# Patient Record
Sex: Male | Born: 1985 | Race: White | Hispanic: No | Marital: Married | Smoking: Never smoker
Health system: Southern US, Community
[De-identification: ages and names within clinical notes are randomized; demographics above are authoritative.]

## PROBLEM LIST (undated history)

## (undated) DIAGNOSIS — F419 Anxiety disorder, unspecified: Secondary | ICD-10-CM

## (undated) DIAGNOSIS — F32A Depression, unspecified: Secondary | ICD-10-CM

## (undated) DIAGNOSIS — F329 Major depressive disorder, single episode, unspecified: Secondary | ICD-10-CM

---
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
## 2010-10-15 ENCOUNTER — Emergency Department (HOSPITAL_BASED_OUTPATIENT_CLINIC_OR_DEPARTMENT_OTHER)
Admission: EM | Admit: 2010-10-15 | Discharge: 2010-10-15 | Payer: Self-pay | Source: Home / Self Care | Admitting: Emergency Medicine

## 2011-03-14 ENCOUNTER — Emergency Department (HOSPITAL_BASED_OUTPATIENT_CLINIC_OR_DEPARTMENT_OTHER)
Admission: EM | Admit: 2011-03-14 | Discharge: 2011-03-14 | Disposition: A | Discharge status: Home / Self Care | Payer: PRIVATE HEALTH INSURANCE | Attending: Emergency Medicine | Admitting: Emergency Medicine

## 2011-03-14 ENCOUNTER — Emergency Department (INDEPENDENT_AMBULATORY_CARE_PROVIDER_SITE_OTHER): Payer: PRIVATE HEALTH INSURANCE

## 2011-03-14 DIAGNOSIS — F988 Other specified behavioral and emotional disorders with onset usually occurring in childhood and adolescence: Secondary | ICD-10-CM | POA: Insufficient documentation

## 2011-03-14 DIAGNOSIS — R1031 Right lower quadrant pain: Secondary | ICD-10-CM | POA: Insufficient documentation

## 2011-03-14 DIAGNOSIS — R109 Unspecified abdominal pain: Secondary | ICD-10-CM

## 2011-03-14 DIAGNOSIS — E785 Hyperlipidemia, unspecified: Secondary | ICD-10-CM | POA: Insufficient documentation

## 2011-03-14 DIAGNOSIS — I10 Essential (primary) hypertension: Secondary | ICD-10-CM | POA: Insufficient documentation

## 2011-03-14 LAB — CBC
HCT: 40.5 % (ref 39.0–52.0)
Hemoglobin: 14.6 g/dL (ref 13.0–17.0)
MCHC: 36 g/dL (ref 30.0–36.0)
MCV: 80.8 fL (ref 78.0–100.0)
RDW: 12.6 % (ref 11.5–15.5)
WBC: 9.1 10*3/uL (ref 4.0–10.5)

## 2011-03-14 LAB — URINALYSIS, ROUTINE W REFLEX MICROSCOPIC
Bilirubin Urine: NEGATIVE
Glucose, UA: NEGATIVE mg/dL
Hgb urine dipstick: NEGATIVE
Ketones, ur: NEGATIVE mg/dL
Specific Gravity, Urine: 1.031 — ABNORMAL HIGH (ref 1.005–1.030)
Urobilinogen, UA: 0.2 mg/dL (ref 0.0–1.0)
pH: 6.5 (ref 5.0–8.0)

## 2011-03-14 LAB — COMPREHENSIVE METABOLIC PANEL
AST: 26 U/L (ref 0–37)
BUN: 20 mg/dL (ref 6–23)
CO2: 27 mEq/L (ref 19–32)
Chloride: 100 mEq/L (ref 96–112)
Creatinine, Ser: 0.8 mg/dL (ref 0.4–1.5)
GFR calc non Af Amer: >60 mL/min (ref >60)
Glucose, Bld: 123 mg/dL — ABNORMAL HIGH (ref 70–99)
Total Bilirubin: 0.3 mg/dL (ref 0.3–1.2)

## 2011-03-14 LAB — DIFFERENTIAL
Basophils Absolute: 0 10*3/uL (ref 0.0–0.1)
Basophils Relative: 0 % (ref 0–1)
Eosinophils Absolute: 0.3 10*3/uL (ref 0.0–0.7)
Eosinophils Relative: 3 % (ref 0–5)
Lymphocytes Relative: 18 % (ref 12–46)
Lymphs Abs: 1.6 10*3/uL (ref 0.7–4.0)
Monocytes Relative: 6 % (ref 3–12)
Neutro Abs: 6.6 10*3/uL (ref 1.7–7.7)
Neutrophils Relative %: 73 % (ref 43–77)

## 2011-03-14 LAB — LIPASE, BLOOD: Lipase: 17 U/L (ref 11–59)

## 2011-03-14 LAB — POCT OCCULT BLOOD STOOL (DEVICE): Fecal Occult Bld: NEGATIVE

## 2011-03-14 MED ORDER — IOHEXOL 300 MG/ML  SOLN
100.0000 mL | Freq: Once | INTRAMUSCULAR | Status: AC | PRN
  Administered 2011-03-14: 100 mL via INTRAVENOUS

## 2011-03-15 NOTE — Op Note (Signed)
   Chad Dunn, Chad Dunn                       ACCOUNT NO.:  [id number]   MEDICAL RECORD NO.:  [id number]                   PATIENT TYPE:  EMS   LOCATION:  ED                                   FACILITY:  [facility]   PHYSICIAN:  Nicki Reaper, M.D.                 DATE OF BIRTH:  01/17/1986   DATE OF PROCEDURE:  05/28/2002  DATE OF DISCHARGE:  05/28/2002                                 OPERATIVE REPORT   PREOPERATIVE DIAGNOSIS:  Laceration of extensor pollicis longus tendon,  right thumb.   POSTOPERATIVE DIAGNOSIS:  Laceration of extensor pollicis longus tendon,  right thumb.   PROCEDURE:  Repair of extensor pollicis longus tendon, right thumb.   SURGEON:  Nicki Reaper, M.D.   ASSISTANT:  None.   ANESTHESIA:  Metacarpal block.   INDICATIONS FOR PROCEDURE:  The patient is a 25 year old male who suffered  laceration of dorsal aspect of his proximal phalanx, right thumb.  He  complains of inability to extend.  He is seen at the request of the  emergency room physician.  Physical examination reveals a laceration over  the proximal phalanx with the inability to extend.   DESCRIPTION OF PROCEDURE:  The patient was given a metacarpal block with 1%  Xylocaine without epinephrine and prepped using Betadine solution.  A  Penrose drain was used for tourniquet control at the base of the finger.  The wound was opened.  The laceration to the extensor tendon was V-shaped in  nature.  This was repaired with multiple figure-of-eight, 4-0 [place]  sutures.  The joint had been exposed.  This was irrigated prior to closure.  The skin was closed with interrupted, 5-0 nylon sutures.  Sterile  compression dressing and splint was applied.  The patient tolerated the  procedure well.  He is discharged home to [facility] to return in one week.                                               Nicki Reaper, M.D.    GRK/MEDQ  D:  05/28/2002  T:  06/04/2002  Job:  [id number]

## 2011-03-15 NOTE — H&P (Signed)
   NAMECREIG, LANDIN                        ACCOUNT NO.:  [id number]   MEDICAL RECORD NO.:  [id number]                   PATIENT TYPE:  EMS   LOCATION:  ED                                   FACILITY:  [facility]   PHYSICIAN:  Nicki Reaper, M.D.                 DATE OF BIRTH:  09/21/1986   DATE OF ADMISSION:  05/28/2002  DATE OF DISCHARGE:                                HISTORY & PHYSICAL   PREOPERATIVE DIAGNOSIS:  Laceration of right thumb.   POSTOPERATIVE DIAGNOSIS:  Laceration of right thumb.   HISTORY:  The patient is an 25 year old male who suffered a laceration over  the dorsal aspect of his proximal phalanx of his right thumb.  He complains  of inability to extend, and was seen to compress by the emergency room  physician, Dr. Carren Rang.   PAST MEDICAL HISTORY:  Negative.   ALLERGIES:  He has no allergies.   MEDICATIONS:  He takes no medicines.   PHYSICAL EXAMINATION:  GENERAL: Physical examination reveals a well  developed, well nourished male in no acute distress.  Alert and oriented.  RIGHT HAND: He has a laceration, V shape in nature, over the dorsal aspect  of the proximal phalanx of his thumb with the inability to extend.  Neurovascularly, the thumb is otherwise is intact.  Flexors are intact.   DIAGNOSIS:  Laceration of extensor pollicis longus tendon, right thumb.   PLAN:  Repair.                                               Nicki Reaper, M.D.    GRK/MEDQ  D:  05/28/2002  T:  06/03/2002  Job:  [id number]

## 2011-09-18 ENCOUNTER — Ambulatory Visit (HOSPITAL_COMMUNITY): Payer: PRIVATE HEALTH INSURANCE | Admitting: Psychiatry

## 2011-11-20 ENCOUNTER — Ambulatory Visit (HOSPITAL_COMMUNITY): Payer: PRIVATE HEALTH INSURANCE | Admitting: Psychiatry

## 2012-09-19 ENCOUNTER — Encounter (HOSPITAL_BASED_OUTPATIENT_CLINIC_OR_DEPARTMENT_OTHER): Payer: Self-pay | Admitting: Emergency Medicine

## 2012-09-19 ENCOUNTER — Emergency Department (HOSPITAL_BASED_OUTPATIENT_CLINIC_OR_DEPARTMENT_OTHER)
Admission: EM | Admit: 2012-09-19 | Discharge: 2012-09-19 | Disposition: A | Discharge status: Home / Self Care | Payer: PRIVATE HEALTH INSURANCE | Attending: Emergency Medicine | Admitting: Emergency Medicine

## 2012-09-19 DIAGNOSIS — R110 Nausea: Secondary | ICD-10-CM | POA: Insufficient documentation

## 2012-09-19 DIAGNOSIS — H6690 Otitis media, unspecified, unspecified ear: Secondary | ICD-10-CM | POA: Insufficient documentation

## 2012-09-19 DIAGNOSIS — H6693 Otitis media, unspecified, bilateral: Secondary | ICD-10-CM

## 2012-09-19 MED ORDER — AZITHROMYCIN 250 MG PO TABS: 250.0000 mg | ORAL_TABLET | Freq: Every day | ORAL | Status: DC

## 2012-09-19 MED ORDER — ONDANSETRON HCL 4 MG PO TABS: 4.0000 mg | ORAL_TABLET | Freq: Four times a day (QID) | ORAL | Status: DC

## 2012-09-19 MED ORDER — ANTIPYRINE-BENZOCAINE 5.4-1.4 % OT SOLN
3.0000 [drp] | Freq: Once | OTIC | Status: AC
  Administered 2012-09-19: 4 [drp] via OTIC
  Filled 2012-09-19: qty 10

## 2012-09-19 MED ORDER — PROMETHAZINE HCL 25 MG/ML IJ SOLN
25.0000 mg | Freq: Once | INTRAMUSCULAR | Status: AC
  Administered 2012-09-19: 25 mg via INTRAMUSCULAR
  Filled 2012-09-19: qty 1

## 2012-09-19 MED ORDER — ONDANSETRON 8 MG PO TBDP
8.0000 mg | ORAL_TABLET | ORAL | Status: AC
  Administered 2012-09-19: 8 mg via ORAL
  Filled 2012-09-19: qty 1

## 2012-09-19 NOTE — ED Notes (Signed)
Patient stated that he still felt nauseous and was given medication. Patient was sipping ginger ale and eating cracker prior to leaving. Instructions given for medication administration

## 2012-09-19 NOTE — ED Notes (Signed)
Family member states pt feels nauseated and wants to lay on the bed for a short time until he feels better.  Pt asking for water, instructed to take small sips until nausea is relieved.

## 2012-09-19 NOTE — ED Provider Notes (Signed)
History     CSN: [id number]  Arrival date & time 09/19/12  0608   First MD Initiated Contact with Patient 09/19/12 [phone]      Chief Complaint  Patient presents with  . Otalgia    (Consider location/radiation/quality/duration/timing/severity/associated sxs/prior treatment) HPI Pt presenting with 24 hours of bilateral ear pain. Pt has had nasal congestion and URI symptoms over the past several days.  No fever, no drainage from ears.  Took percocet and vicodin tonight for pain and now has developed nausea and dry heaves.  Has also taken claritin D.  Pain initially in left ear, has moved to right ear as well.  No shortness of breath or cough.  No difficulty swallowing.  Has been able to keep down liquids.  There are no other associated systemic symptoms, there are no other alleviating or modifying factors.   History reviewed. No pertinent past medical history.  History reviewed. No pertinent past surgical history.  No family history on file.  History  Substance Use Topics  . Smoking status: Never Smoker   . Smokeless tobacco: Not on file  . Alcohol Use: No      Review of Systems ROS reviewed and all otherwise negative except for mentioned in HPI  Allergies  Review of patient's allergies indicates no known allergies.  Home Medications   Current Outpatient Rx  Name  Route  Sig  Dispense  Refill  . ATOMOXETINE HCL 10 MG PO CAPS   Oral   Take 80 mg by mouth daily.         Marland Kitchen ESCITALOPRAM OXALATE 20 MG PO TABS   Oral   Take 20 mg by mouth daily.         Marland Kitchen LORATADINE-PSEUDOEPHEDRINE ER 10-240 MG PO TB24   Oral   Take 1 tablet by mouth once.         . AZITHROMYCIN 250 MG PO TABS   Oral   Take 1 tablet (250 mg total) by mouth daily. Take 2 tabs po qD on the first day, then 1 tab po qD x 4 days   6 tablet   0   . ONDANSETRON HCL 4 MG PO TABS   Oral   Take 1 tablet (4 mg total) by mouth every 6 (six) hours.   12 tablet   0     BP 131/64  Pulse 97  Temp 98  F (36.7 C) (Oral)  Resp 21  SpO2 99% Vitals reviewed Physical Exam Physical Examination: General appearance - alert, well appearing, and in no distress Mental status - alert, oriented to person, place, and time Eyes - pupils equal and reactive, no conjunctival injection, no scleral icterus Ears - bilateral TM's with pus/bulging/erythema, bilateral EACs normal Mouth - mucous membranes moist, pharynx normal without lesions Chest - clear to auscultation, no wheezes, rales or rhonchi, symmetric air entry Heart - normal rate, regular rhythm, normal S1, S2, no murmurs, rubs, clicks or gallops Extremities - peripheral pulses normal, no pedal edema, no clubbing or cyanosis Skin - normal coloration and turgor, no rashes  ED Course  Procedures (including critical care time)  Labs Reviewed - No data to display No results found.   1. Bilateral otitis media       MDM  Pt with bilateral OM on exam- no perforation of TM.  Marland Kitchen  Nausea likely due to the pain medications.  He is overall nontoxic and well hydrated.  Pt started on auralgan, zofran.  Will also start on zpack.  Discharged with strict return precautions.  Pt agreeable with plan.        Ethelda Chick, MD 09/19/12 [phone]

## 2012-09-19 NOTE — ED Provider Notes (Signed)
Patient still nauseated prior to discharge.  Administered 25 mg Phenergan IM  Nelia Shi, MD 09/19/12 [phone]

## 2012-09-19 NOTE — ED Notes (Signed)
Pt c/o ear pain bilaterally since last night. Pt states he took a percocet earlier last night and a vicodin early this am. Pt states he has had vomiting since taking medications. Pt has nausea now.

## 2012-09-19 NOTE — ED Notes (Signed)
Pt requesting to lay in bed for a bit longer until zofran relieves nausea. Pt allowed to stay until feeling better. MD made aware.

## 2012-09-19 NOTE — ED Notes (Signed)
MD at bedside. 

## 2015-10-31 ENCOUNTER — Encounter (HOSPITAL_BASED_OUTPATIENT_CLINIC_OR_DEPARTMENT_OTHER): Payer: Self-pay | Admitting: *Deleted

## 2015-10-31 DIAGNOSIS — Z79899 Other long term (current) drug therapy: Secondary | ICD-10-CM | POA: Insufficient documentation

## 2015-10-31 DIAGNOSIS — K0889 Other specified disorders of teeth and supporting structures: Secondary | ICD-10-CM | POA: Insufficient documentation

## 2015-10-31 DIAGNOSIS — Z792 Long term (current) use of antibiotics: Secondary | ICD-10-CM | POA: Insufficient documentation

## 2015-10-31 DIAGNOSIS — F419 Anxiety disorder, unspecified: Secondary | ICD-10-CM | POA: Insufficient documentation

## 2015-10-31 DIAGNOSIS — F329 Major depressive disorder, single episode, unspecified: Secondary | ICD-10-CM | POA: Insufficient documentation

## 2015-10-31 DIAGNOSIS — Z79891 Long term (current) use of opiate analgesic: Secondary | ICD-10-CM | POA: Insufficient documentation

## 2015-10-31 NOTE — ED Notes (Signed)
Dental pain x 2 days

## 2015-11-01 ENCOUNTER — Emergency Department (HOSPITAL_BASED_OUTPATIENT_CLINIC_OR_DEPARTMENT_OTHER)
Admission: EM | Admit: 2015-11-01 | Discharge: 2015-11-01 | Disposition: A | Discharge status: Home / Self Care | Payer: PRIVATE HEALTH INSURANCE | Attending: Emergency Medicine | Admitting: Emergency Medicine

## 2015-11-01 DIAGNOSIS — K0889 Other specified disorders of teeth and supporting structures: Secondary | ICD-10-CM

## 2015-11-01 HISTORY — DX: Depression, unspecified: F32.A

## 2015-11-01 HISTORY — DX: Major depressive disorder, single episode, unspecified: F32.9

## 2015-11-01 HISTORY — DX: Anxiety disorder, unspecified: F41.9

## 2015-11-01 MED ORDER — KETOROLAC TROMETHAMINE 60 MG/2ML IM SOLN
60.0000 mg | Freq: Once | INTRAMUSCULAR | Status: AC
  Administered 2015-11-01: 60 mg via INTRAMUSCULAR
  Filled 2015-11-01: qty 2

## 2015-11-01 MED ORDER — OXYCODONE-ACETAMINOPHEN 5-325 MG PO TABS
1.0000 | ORAL_TABLET | Freq: Once | ORAL | Status: AC
  Administered 2015-11-01: 1 via ORAL
  Filled 2015-11-01: qty 1

## 2015-11-01 MED ORDER — BUPIVACAINE-EPINEPHRINE (PF) 0.5% -1:200000 IJ SOLN
INTRAMUSCULAR | Status: AC
  Administered 2015-11-01: 1.8 mL
  Filled 2015-11-01: qty 1.8

## 2015-11-01 MED ORDER — IBUPROFEN 600 MG PO TABS: 600.0000 mg | ORAL_TABLET | Freq: Four times a day (QID) | ORAL | Status: DC | PRN

## 2015-11-01 NOTE — ED Provider Notes (Signed)
CSN: [id number]     Arrival date & time 10/31/15  2233 History   First MD Initiated Contact with Patient 11/01/15 [phone]     Chief Complaint  Patient presents with  . Dental Pain     (Consider location/radiation/quality/duration/timing/severity/associated sxs/prior Treatment) HPI   This is a 30 year old male who presents with dental pain. Patient reports onset of symptoms on January 1. Reports left-sided sinus pressure. He saw his ENT doctor and received a steroid shot. He is currently on a Z-Pak. Patient states that he had worsening pain and was able to localize it to his left upper jaw and gumline. He subsequently saw his dentist who told him he has something  Wrong with his premolar. He was given hydrocodone. No dental procedures were performed. He reports hydrocodone is not helping him. He has not taken any anti-inflammatories. He reports dropping 10 out of 10 pain in the right upper premolar region. Denies fevers or sore throat.  Past Medical History  Diagnosis Date  . Anxiety   . Depression    History reviewed. No pertinent past surgical history. No family history on file. Social History  Substance Use Topics  . Smoking status: Never Smoker   . Smokeless tobacco: None  . Alcohol Use: No    Review of Systems  Constitutional: Negative for fever.  HENT: Positive for dental problem. Negative for facial swelling, sore throat and trouble swallowing.   All other systems reviewed and are negative.     Allergies  Review of patient's allergies indicates no known allergies.  Home Medications   Prior to Admission medications   Medication Sig Start Date End Date Taking? Authorizing Provider  atomoxetine (STRATTERA) 10 MG capsule Take 80 mg by mouth daily.    Historical Provider, MD  azithromycin (ZITHROMAX Z-PAK) 250 MG tablet Take 1 tablet (250 mg total) by mouth daily. Take 2 tabs po qD on the first day, then 1 tab po qD x 4 days 09/19/12   Jerelyn ScottMartha Linker, MD  escitalopram  (LEXAPRO) 20 MG tablet Take 20 mg by mouth daily.    Historical Provider, MD  ibuprofen (ADVIL,MOTRIN) 600 MG tablet Take 1 tablet (600 mg total) by mouth every 6 (six) hours as needed. 11/01/15   Shon Batonourtney F Horton, MD  loratadine-pseudoephedrine (CLARITIN-D 24-HOUR) 10-240 MG per 24 hr tablet Take 1 tablet by mouth once.    Historical Provider, MD  ondansetron (ZOFRAN) 4 MG tablet Take 1 tablet (4 mg total) by mouth every 6 (six) hours. 09/19/12   Jerelyn ScottMartha Linker, MD   BP 143/106 mmHg  Pulse 92  Temp(Src) 98.2 F (36.8 C) (Oral)  Resp 20  Ht 5\' 9"  (1.753 m)  Wt 193 lb (87.544 kg)  BMI 28.49 kg/m2  SpO2 98% Physical Exam  Constitutional: He is oriented to person, place, and time. He appears well-developed and well-nourished.   Uncomfortable appearing, no acute distress  HENT:  Head: Normocephalic and atraumatic.   No significant facial swelling noted, tenderness palpation of the right upper premolar region, no obvious abscess or infection noted, no trismus, normal elevation of the tongue  Eyes: Pupils are equal, round, and reactive to light.  Cardiovascular: Normal rate and regular rhythm.   Pulmonary/Chest: Effort normal. No respiratory distress.  Neurological: He is alert and oriented to person, place, and time.  Skin: Skin is warm and dry.  Psychiatric: He has a normal mood and affect.  Nursing note and vitals reviewed.   ED Course  Dental Date/Time: 11/01/2015 6:47 AM Performed  by: HORTON, COURTNEY F Authorized by: Shon Baton Consent: Verbal consent obtained. Risks and benefits: risks, benefits and alternatives were discussed Consent given by: patient Local anesthesia used: yes Anesthesia: local infiltration Local anesthetic: bupivacaine 0.5% with epinephrine Patient sedated: no Patient tolerance: Patient tolerated the procedure well with no immediate complications Comments:  Periapical dental block performed   (including critical care time)  Labs Review Labs  Reviewed - No data to display  Imaging Review No results found. I have personally reviewed and evaluated these images and lab results as part of my medical decision-making.   EKG Interpretation None      MDM   Final diagnoses:  Pain, dental     presents with persistent dental pain. Has seen his dentist and an ENT doctor. Is currently on antibiotics and hydrocodone. No obvious signs of infection on exam. No recent dental procedures. Patient was given IM Toradol and hydrocodone.  Total block performed. Encouraged patient to follow-up with his dentist as soon as possible for recheck.  After history, exam, and medical workup I feel the patient has been appropriately medically screened and is safe for discharge home. Pertinent diagnoses were discussed with the patient. Patient was given return precautions.     Shon Baton, MD 11/01/15 [phone]

## 2015-11-01 NOTE — Discharge Instructions (Signed)

## 2015-11-01 NOTE — ED Notes (Signed)
Call to waiting area w/o reply

## 2017-02-16 ENCOUNTER — Encounter (HOSPITAL_BASED_OUTPATIENT_CLINIC_OR_DEPARTMENT_OTHER): Payer: Self-pay | Admitting: Emergency Medicine

## 2017-02-16 ENCOUNTER — Emergency Department (HOSPITAL_BASED_OUTPATIENT_CLINIC_OR_DEPARTMENT_OTHER): Payer: BLUE CROSS/BLUE SHIELD

## 2017-02-16 ENCOUNTER — Emergency Department (HOSPITAL_BASED_OUTPATIENT_CLINIC_OR_DEPARTMENT_OTHER)
Admission: EM | Admit: 2017-02-16 | Discharge: 2017-02-16 | Disposition: A | Discharge status: Home / Self Care | Payer: BLUE CROSS/BLUE SHIELD | Attending: Emergency Medicine | Admitting: Emergency Medicine

## 2017-02-16 DIAGNOSIS — Y939 Activity, unspecified: Secondary | ICD-10-CM | POA: Insufficient documentation

## 2017-02-16 DIAGNOSIS — R0789 Other chest pain: Secondary | ICD-10-CM

## 2017-02-16 DIAGNOSIS — Y999 Unspecified external cause status: Secondary | ICD-10-CM | POA: Diagnosis not present

## 2017-02-16 DIAGNOSIS — W1781XA Fall down embankment (hill), initial encounter: Secondary | ICD-10-CM | POA: Insufficient documentation

## 2017-02-16 DIAGNOSIS — Y92830 Public park as the place of occurrence of the external cause: Secondary | ICD-10-CM | POA: Insufficient documentation

## 2017-02-16 DIAGNOSIS — Z79899 Other long term (current) drug therapy: Secondary | ICD-10-CM | POA: Diagnosis not present

## 2017-02-16 DIAGNOSIS — S299XXA Unspecified injury of thorax, initial encounter: Secondary | ICD-10-CM | POA: Diagnosis present

## 2017-02-16 MED ORDER — LIDOCAINE 5 % EX PTCH: 1.0000 | MEDICATED_PATCH | CUTANEOUS | 0 refills | Status: DC

## 2017-02-16 MED ORDER — IBUPROFEN 800 MG PO TABS: 800.0000 mg | ORAL_TABLET | Freq: Three times a day (TID) | ORAL | 0 refills | Status: AC | PRN

## 2017-02-16 MED ORDER — IBUPROFEN 800 MG PO TABS
800.0000 mg | ORAL_TABLET | Freq: Once | ORAL | Status: AC
  Administered 2017-02-16: 800 mg via ORAL
  Filled 2017-02-16: qty 1

## 2017-02-16 NOTE — ED Provider Notes (Signed)
Emergency Department Provider Note  By signing my name below, I, Avnee Patel, attest that this documentation has been prepared under the direction and in the presence of Maia Plan, MD  Electronically Signed: Clovis Pu, ED Scribe. 02/16/17. 8:27 PM.  I have reviewed the triage vital signs and the nursing notes.   HISTORY  Chief Complaint Fall   HPI Chad Dunn is a 31 y.o. male complaining of acute onset, moderate left sided chest pain s/pan incident which occurred earlier today. Pt states he suddenly felt s pressurized sensation while [place] the park. His pain is worse with deep breathing and with palpation. He has applied ice with no relief. Pt denies any other associated symptoms. No other complaints noted at this time. No radiation.    Past Medical History:  Diagnosis Date  . Anxiety   . Depression     There are no active problems to display for this patient.   History reviewed. No pertinent surgical history.  Current Outpatient Rx  . Order #: [id number] Class: Historical Med  . Order #: [id number] Class: Print  . Order #: [id number] Class: Historical Med  . Order #: [id number] Class: Print  . Order #: [id number] Class: Print  . Order #: [id number] Class: Historical Med  . Order #: [id number] Class: Print    Allergies Patient has no known allergies.  No family history on file.  Social History Social History  Substance Use Topics  . Smoking status: Never Smoker  . Smokeless tobacco: Not on file  . Alcohol use No    Review of Systems  Constitutional: No fever/chills Eyes: No visual changes. ENT: No sore throat. Cardiovascular: +chest pain. Respiratory: Denies shortness of breath. Gastrointestinal: No abdominal pain.  No nausea, no vomiting.  No diarrhea.  No constipation. Genitourinary: Negative for dysuria. Musculoskeletal: Negative for back pain. Positive chest wall pain.  Skin: Negative for rash. Neurological: Negative for headaches,  focal weakness or numbness.  10-point ROS otherwise negative.  ____________________________________________   PHYSICAL EXAM:  VITAL SIGNS: ED Triage Vitals  Enc Vitals Group     BP 02/16/17 1958 127/86     Pulse Rate 02/16/17 1958 94     Resp 02/16/17 1958 18     Temp 02/16/17 1958 98.4 F (36.9 C)     Temp Source 02/16/17 1958 Oral     SpO2 02/16/17 1958 98 %     Weight 02/16/17 1958 206 lb (93.4 kg)     Height 02/16/17 1958  (1.778 m)     Pain Score 02/16/17 2003 9   Constitutional: Alert and oriented. Well appearing and in no acute distress. Eyes: Conjunctivae are normal.  Head: Atraumatic. Nose: No congestion/rhinnorhea. Mouth/Throat: Mucous membranes are moist.  Oropharynx non-erythematous. Neck: No stridor. Cardiovascular: Normal rate, regular rhythm. Good peripheral circulation. Grossly normal heart sounds.   Respiratory: Normal respiratory effort.  No retractions. Lungs CTAB. Gastrointestinal: Soft and nontender. No distention.  Musculoskeletal: No lower extremity tenderness nor edema. No gross deformities of extremities. Positive tenderness to palpation of the left, anterior chest wall.  Neurologic:  Normal speech and language. No gross focal neurologic deficits are appreciated.  Skin:  Skin is warm, dry and intact. No rash noted. Psychiatric: Mood and affect are normal. Speech and behavior are normal.  ____________________________________________  RADIOLOGY  Dg Chest 2 View  Result Date: 02/16/2017 CLINICAL DATA:  32 year old male with history of trauma after falling down a hill complaining of left upper chest pain. EXAM: CHEST  2 VIEW COMPARISON:  No priors. FINDINGS: Lung volumes are normal. No consolidative airspace disease. No pleural effusions. No pneumothorax. No pulmonary nodule or mass noted. Pulmonary vasculature and the cardiomediastinal silhouette are within normal limits. IMPRESSION: No radiographic evidence of acute cardiopulmonary disease.  Electronically Signed   By: Trudie Reed M.D.   On: 02/16/2017 20:27    ____________________________________________   PROCEDURES  Procedure(s) performed:   Procedures  None ____________________________________________   INITIAL IMPRESSION / ASSESSMENT AND PLAN / ED COURSE  Pertinent labs & imaging results that were available during my care of the patient were reviewed by me and considered in my medical decision making (see chart for details).  Patient presents to the ED with chest wall pain after rolling down the hill with his child. He has tenderness to palpation over the chest wall. Clinically this is consistent with MSK related chest pain. Extremely low suspicion for ACS, PE, or other vascular etiology of pain. CXR with no PNX or obvious rib fx. Will manage pain at home with NSAIDs. Plan for PCP follow up.   At this time, I do not feel there is any life-threatening condition present. I have reviewed and discussed all results (EKG, imaging, lab, urine as appropriate), exam findings with patient. I have reviewed nursing notes and appropriate previous records.  I feel the patient is safe to be discharged home without further emergent workup. Discussed usual and customary return precautions. Patient and family (if present) verbalize understanding and are comfortable with this plan.  Patient will follow-up with their primary care provider. If they do not have a primary care provider, information for follow-up has been provided to them. All questions have been answered.  ____________________________________________  FINAL CLINICAL IMPRESSION(S) / ED DIAGNOSES  Final diagnoses:  Chest wall pain    MEDICATIONS GIVEN DURING THIS VISIT:  Medications  ibuprofen (ADVIL,MOTRIN) tablet 800 mg (800 mg Oral Given 02/16/17 2056)    NEW OUTPATIENT MEDICATIONS STARTED DURING THIS VISIT:  Discharge Medication List as of 02/16/2017  8:41 PM    START taking these medications   Details    lidocaine (LIDODERM) 5 % Place 1 patch onto the skin daily. Remove & Discard patch within 12 hours or as directed by MD, Starting Sun 02/16/2017, Print        Note:  This document was prepared using Dragon voice recognition software and may include unintentional dictation errors.  Alona Bene, MD Emergency Medicine  I personally performed the services described in this documentation, which was scribed in my presence. The recorded information has been reviewed and is accurate.       Maia Plan, MD 02/17/17 [phone]

## 2017-02-16 NOTE — Discharge Instructions (Signed)

## 2017-02-16 NOTE — ED Notes (Signed)
Pt discharged to home with family. NAD.  

## 2017-02-16 NOTE — ED Triage Notes (Signed)
Pt presents to ED with c/o left chest pain after falling and rolling with child at the park. Pt reports pain with inspiration and palpation.

## 2017-04-04 ENCOUNTER — Emergency Department (HOSPITAL_BASED_OUTPATIENT_CLINIC_OR_DEPARTMENT_OTHER)
Admission: EM | Admit: 2017-04-04 | Discharge: 2017-04-04 | Disposition: A | Discharge status: Home / Self Care | Payer: BLUE CROSS/BLUE SHIELD | Attending: Emergency Medicine | Admitting: Emergency Medicine

## 2017-04-04 ENCOUNTER — Encounter (HOSPITAL_BASED_OUTPATIENT_CLINIC_OR_DEPARTMENT_OTHER): Payer: Self-pay

## 2017-04-04 DIAGNOSIS — K0889 Other specified disorders of teeth and supporting structures: Secondary | ICD-10-CM | POA: Insufficient documentation

## 2017-04-04 DIAGNOSIS — Z79899 Other long term (current) drug therapy: Secondary | ICD-10-CM | POA: Insufficient documentation

## 2017-04-04 MED ORDER — OXYCODONE-ACETAMINOPHEN 5-325 MG PO TABS: 1.0000 | ORAL_TABLET | Freq: Four times a day (QID) | ORAL | 0 refills | Status: DC | PRN

## 2017-04-04 NOTE — ED Provider Notes (Signed)
[facility] Provider Note   CSN: [id number] Arrival date & time: 04/04/17  0502     History   Chief Complaint Chief Complaint  Patient presents with  . Dental Pain    HPI Chad Dunn is a 31 y.o. male.  HPI  Pt presneting with c/o left sided dental pain. He has left upper and lower wisdom teeth coming in and has been having worsening pain over the past several days.  He was seen at urgent care yesterday and given rx for mobic and augmentin- he has taken one dose of each without any relief of the pain.  Was not able to sleep last night due to the pain.  No fever/chills. Pain is throbbing and constant.  He has also been trying cold packs on face.  He also took some ibuprofen.  No difficulty breathing or swallowing.  There are no other associated systemic symptoms, there are no other alleviating or modifying factors.   Past Medical History:  Diagnosis Date  . Anxiety   . Depression     There are no active problems to display for this patient.   History reviewed. No pertinent surgical history.     Home Medications    Prior to Admission medications   Medication Sig Start Date End Date Taking? Authorizing Provider  atomoxetine (STRATTERA) 10 MG capsule Take 80 mg by mouth daily.    [provider]  azithromycin (ZITHROMAX Z-PAK) 250 MG tablet Take 1 tablet (250 mg total) by mouth daily. Take 2 tabs po qD on the first day, then 1 tab po qD x 4 days 09/19/12   Jerelyn ScottLinker, Martha, MD  escitalopram (LEXAPRO) 20 MG tablet Take 20 mg by mouth daily.    [provider]  ibuprofen (ADVIL,MOTRIN) 800 MG tablet Take 1 tablet (800 mg total) by mouth every 8 (eight) hours as needed. 02/16/17   Long, Arlyss RepressJoshua G, MD  lidocaine (LIDODERM) 5 % Place 1 patch onto the skin daily. Remove & Discard patch within 12 hours or as directed by MD 02/16/17   Long, Arlyss RepressJoshua G, MD  loratadine-pseudoephedrine (CLARITIN-D 24-HOUR) 10-240 MG per 24 hr tablet Take 1 tablet by mouth  once.    [provider]  ondansetron (ZOFRAN) 4 MG tablet Take 1 tablet (4 mg total) by mouth every 6 (six) hours. 09/19/12   Jerelyn ScottLinker, Martha, MD  oxyCODONE-acetaminophen (PERCOCET/ROXICET) 5-325 MG tablet Take 1-2 tablets by mouth every 6 (six) hours as needed for severe pain. 04/04/17   Jerelyn ScottLinker, Martha, MD    Family History No family history on file.  Social History Social History  Substance Use Topics  . Smoking status: Never Smoker  . Smokeless tobacco: Not on file  . Alcohol use No     Allergies   Patient has no known allergies.   Review of Systems Review of Systems  ROS reviewed and all otherwise negative except for mentioned in HPI   Physical Exam Updated Vital Signs BP (!) 140/95 (BP Location: Right Arm)   Pulse 95   Temp 98.1 F (36.7 C) (Oral)   Resp 18   Ht 5\' 10"  (1.778 m)   Wt 95.3 kg (210 lb)   SpO2 98%   BMI 30.13 kg/m  Vitals reivewed Physical Exam Physical Examination: General appearance - alert, well appearing, and in no distress Mental status - alert, oriented to person, place, and time Eyes - no conjunctival injection no scleral icterus Mouth - MMM, mild left sided facial swelling, partial eruption of left  upper and left lower posterior molars with tenderness to palpation of both Neck - supple, no significant adenopathy Chest - normal respiratory effort Neurological - alert, oriented, normal speech Extremities - peripheral pulses normal, no pedal edema, no clubbing or cyanosis Skin - normal coloration and turgor, no rashes  ED Treatments / Results  Labs (all labs ordered are listed, but only abnormal results are displayed) Labs Reviewed - No data to display  EKG  EKG Interpretation None       Radiology No results found.  Procedures Procedures (including critical care time)  Medications Ordered in ED Medications - No data to display   Initial Impression / Assessment and Plan / ED Course  I have reviewed the triage vital  signs and the nursing notes.  Pertinent labs & imaging results that were available during my care of the patient were reviewed by me and considered in my medical decision making (see chart for details).     Pt presenting with ongoing dental pain that has not responded to meloxicam, ibuprofen, ice packs.  He has been prescribed augmentin and has started taking that.  He has evidence of partial eruption of wisdom teeth on left and likely abscess.  Will give rx for pain control.  Pt has already started trying to contact dentists for extraction.  Discharged with strict return precautions.  Pt agreeable with plan.  Final Clinical Impressions(s) / ED Diagnoses   Final diagnoses:  Pain, dental    New Prescriptions Discharge Medication List as of 04/04/2017  5:24 AM    START taking these medications   Details  oxyCODONE-acetaminophen (PERCOCET/ROXICET) 5-325 MG tablet Take 1-2 tablets by mouth every 6 (six) hours as needed for severe pain., Starting Fri 04/04/2017, Print         Jerelyn Scott, MD 04/04/17 [phone]

## 2017-04-04 NOTE — ED Notes (Signed)
Pt verbalizes understanding of d/c instructions and denies any further needs at this time. 

## 2017-04-04 NOTE — ED Triage Notes (Signed)
Pt c/o dental pain for the last day, received prescription for mobic and augmentin, tried warm and cold compresses, tried ora-jel and nothing is helping

## 2017-04-04 NOTE — Discharge Instructions (Signed)
Return to the ED with any concerns including difficulty breathing or swallowing, fever/chills, vomiting and not able to keep down liquids or antibiotics, decreased level of alertness/lethargy, or any other alarming symptoms

## 2019-04-01 IMAGING — DX DG CHEST 2V
2 series · 2 of 2 positions shown · non-contrast
Comparison: No priors.

CLINICAL DATA: 30-year-old male with history of trauma after
falling down a hill complaining of left upper chest pain.

EXAM:
CHEST  2 VIEW

[chest pa]
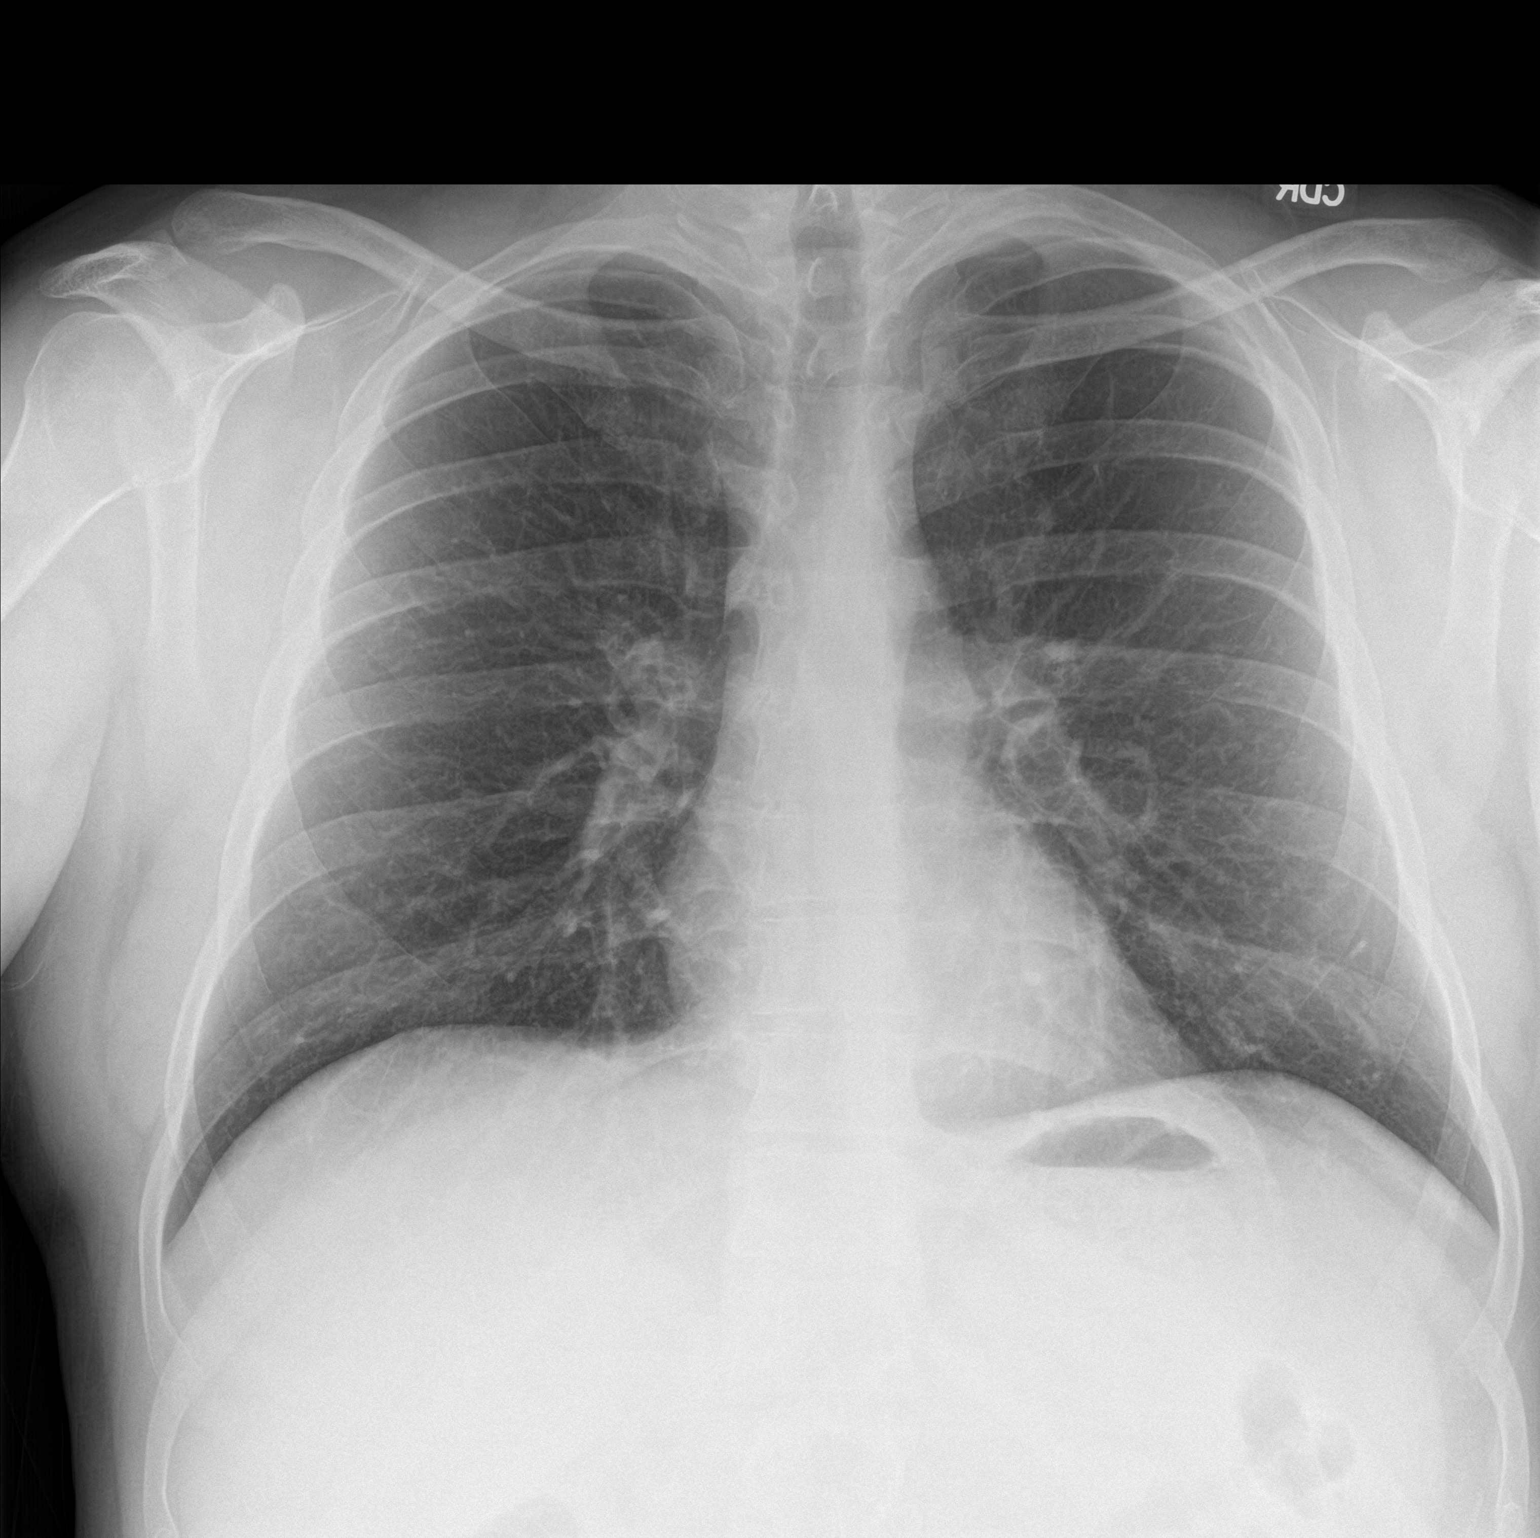

[chest lat]
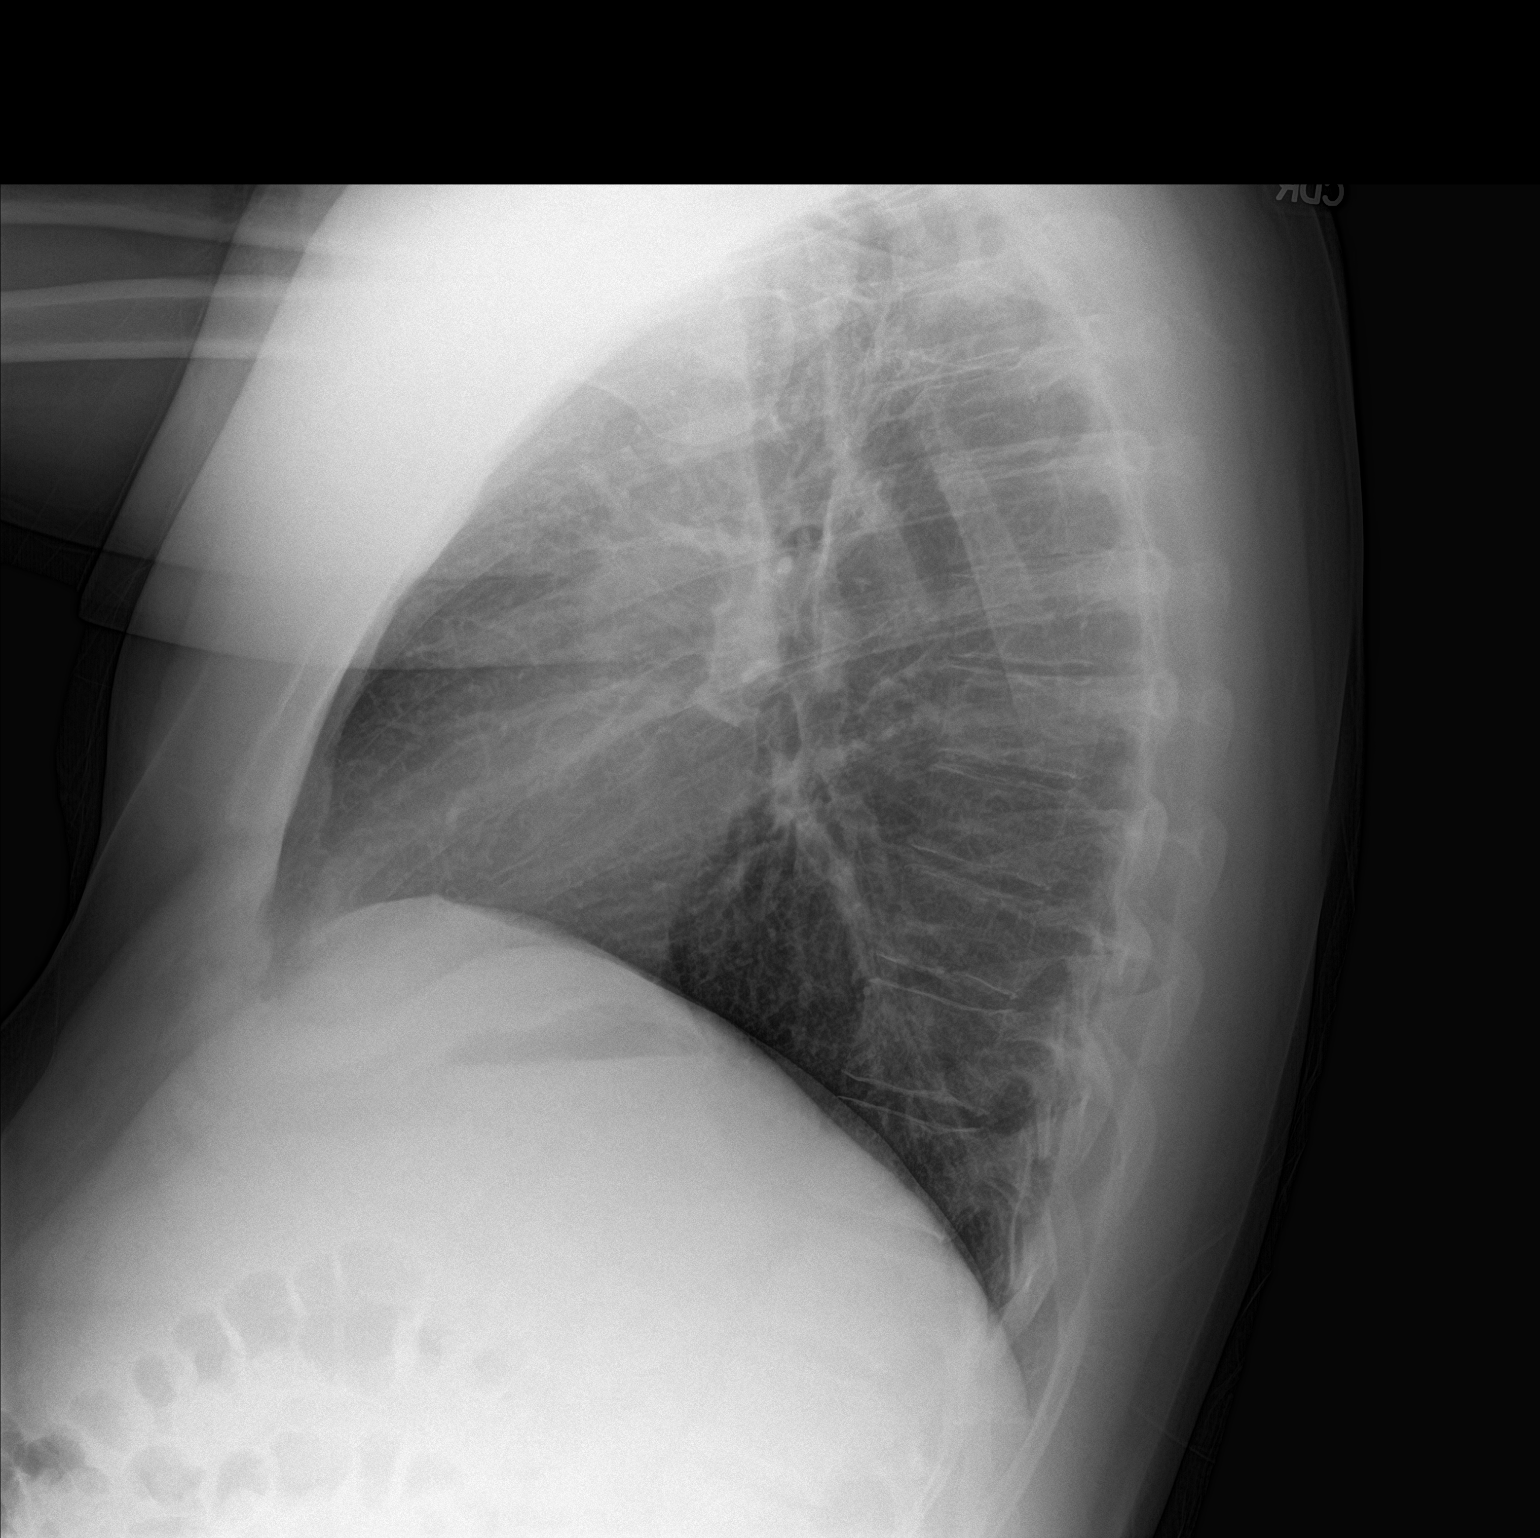

[2 of 2 positions shown; findings below may reference images not displayed]

FINDINGS: Lung volumes are normal. No consolidative airspace disease. No
pleural effusions. No pneumothorax. No pulmonary nodule or mass
noted. Pulmonary vasculature and the cardiomediastinal silhouette
are within normal limits.
IMPRESSION: No radiographic evidence of acute cardiopulmonary disease.

## 2023-07-20 ENCOUNTER — Emergency Department (HOSPITAL_BASED_OUTPATIENT_CLINIC_OR_DEPARTMENT_OTHER): Payer: Medicaid Other

## 2023-07-20 ENCOUNTER — Emergency Department (HOSPITAL_BASED_OUTPATIENT_CLINIC_OR_DEPARTMENT_OTHER)
Admission: EM | Admit: 2023-07-20 | Discharge: 2023-07-21 | Disposition: A | Discharge status: Home / Self Care | Payer: Medicaid Other | Attending: Emergency Medicine | Admitting: Emergency Medicine

## 2023-07-20 ENCOUNTER — Other Ambulatory Visit: Payer: Self-pay

## 2023-07-20 ENCOUNTER — Encounter (HOSPITAL_BASED_OUTPATIENT_CLINIC_OR_DEPARTMENT_OTHER): Payer: Self-pay | Admitting: Emergency Medicine

## 2023-07-20 DIAGNOSIS — S59911A Unspecified injury of right forearm, initial encounter: Secondary | ICD-10-CM | POA: Diagnosis present

## 2023-07-20 DIAGNOSIS — S59001A Unspecified physeal fracture of lower end of ulna, right arm, initial encounter for closed fracture: Secondary | ICD-10-CM | POA: Insufficient documentation

## 2023-07-20 DIAGNOSIS — W010XXA Fall on same level from slipping, tripping and stumbling without subsequent striking against object, initial encounter: Secondary | ICD-10-CM | POA: Diagnosis not present

## 2023-07-20 MED ORDER — OXYCODONE-ACETAMINOPHEN 5-325 MG PO TABS
2.0000 | ORAL_TABLET | Freq: Once | ORAL | Status: AC
  Administered 2023-07-20: 2 via ORAL
  Filled 2023-07-20: qty 2

## 2023-07-20 NOTE — ED Triage Notes (Signed)
Ot reports tripping and falling into the wooden frame of a box spring hitting his right forearm, swelling noted, no ecchymosis, limited ROM

## 2023-07-21 MED ORDER — OXYCODONE-ACETAMINOPHEN 5-325 MG PO TABS: 2.0000 | ORAL_TABLET | ORAL | 0 refills | Status: AC | PRN

## 2023-07-21 NOTE — ED Provider Notes (Signed)
[place] [facility] Provider Note   CSN: [id number] Arrival date & time: 07/20/23  2306     History  Chief Complaint  Patient presents with   Arm Injury    Chad Dunn is a 37 y.o. male.  37 yo M who tripped over a boxspring landing on right forearm with pain and pressure in distal ulnar side of the arm. No other injuries.    Arm Injury      Home Medications Prior to Admission medications   Medication Sig Start Date End Date Taking? Authorizing Provider  oxyCODONE-acetaminophen (PERCOCET) 5-325 MG tablet Take 2 tablets by mouth every 4 (four) hours as needed for severe pain. 07/21/23  Yes Wenda Vanschaick, Barbara Cower, MD  atomoxetine (STRATTERA) 10 MG capsule Take 80 mg by mouth daily.    [provider]  escitalopram (LEXAPRO) 20 MG tablet Take 20 mg by mouth daily.    [provider]  ibuprofen (ADVIL,MOTRIN) 800 MG tablet Take 1 tablet (800 mg total) by mouth every 8 (eight) hours as needed. 02/16/17   Long, Arlyss Repress, MD      Allergies    Patient has no known allergies.    Review of Systems   Review of Systems  Physical Exam Updated Vital Signs BP 124/79   Pulse 85   Temp 97.8 F (36.6 C) (Oral)   Resp 18   Ht 5\' 11"  (1.803 m)   Wt 91.2 kg   SpO2 100%   BMI 28.03 kg/m  Physical Exam Vitals and nursing note reviewed.  Constitutional:      Appearance: He is well-developed.  HENT:     Head: Normocephalic and atraumatic.  Cardiovascular:     Rate and Rhythm: Normal rate.  Pulmonary:     Effort: Pulmonary effort is normal. No respiratory distress.  Abdominal:     General: There is no distension.  Musculoskeletal:        General: Tenderness (ulnar side of right distal forearm with mild edema, NVI distally) present. Normal range of motion.     Cervical back: Normal range of motion.  Neurological:     Mental Status: He is alert.     ED Results / Procedures / Treatments   Labs (all labs ordered are listed,  but only abnormal results are displayed) Labs Reviewed - No data to display  EKG None  Radiology DG Forearm Right  Result Date: 07/21/2023 CLINICAL DATA:  Fall EXAM: RIGHT FOREARM - 2 VIEW COMPARISON:  None Available. FINDINGS: The examination is limited by the patient's limited tolerance for positioning. Single view examination of the distal right forearm demonstrates an acute, oblique fracture of the distal right ulnar diaphysis with 1 cortical with ulnar displacement of the distal fracture fragment which appears in anatomic alignment on this limited examination. Distal radioulnar joint demonstrates normal alignment. No other fracture or dislocation identified. Soft tissues are unremarkable. IMPRESSION: 1. Acute, oblique fracture of the distal right ulnar diaphysis. Electronically Signed   By: Helyn Numbers M.D.   On: 07/21/2023 00:16    Procedures Procedures    Medications Ordered in ED Medications  oxyCODONE-acetaminophen (PERCOCET/ROXICET) 5-325 MG per tablet 2 tablet (2 tablets Oral Given 07/20/23 2339)    ED Course/ Medical Decision Making/ A&P                                 Medical Decision Making Amount and/or Complexity of Data  Reviewed Radiology: ordered.  Risk Prescription drug management.   Xr viewed by myself at bedside with comminuted mildly displaced distal ulnar shaft fracture, no obvious radial fracture. Rad read reviewed. NVI. Pain improved with meds and splint, Rx sent in. Hand referral placed.   Opiate precautions provided.   Final Clinical Impression(s) / ED Diagnoses Final diagnoses:  Displaced physeal fracture of distal end of right ulna, initial encounter    Rx / DC Orders ED Discharge Orders          Ordered    oxyCODONE-acetaminophen (PERCOCET) 5-325 MG tablet  Every 4 hours PRN        07/21/23 0123              Stpehen Petitjean, Barbara Cower, MD 07/21/23 0321

## 2023-08-11 ENCOUNTER — Encounter (HOSPITAL_BASED_OUTPATIENT_CLINIC_OR_DEPARTMENT_OTHER): Payer: Self-pay

## 2023-08-11 ENCOUNTER — Emergency Department (HOSPITAL_BASED_OUTPATIENT_CLINIC_OR_DEPARTMENT_OTHER)
Admission: EM | Admit: 2023-08-11 | Discharge: 2023-08-12 | Disposition: A | Discharge status: Home / Self Care | Payer: Medicaid Other | Attending: Emergency Medicine | Admitting: Emergency Medicine

## 2023-08-11 ENCOUNTER — Emergency Department (HOSPITAL_BASED_OUTPATIENT_CLINIC_OR_DEPARTMENT_OTHER): Payer: Medicaid Other

## 2023-08-11 ENCOUNTER — Other Ambulatory Visit: Payer: Self-pay

## 2023-08-11 DIAGNOSIS — S29002A Unspecified injury of muscle and tendon of back wall of thorax, initial encounter: Secondary | ICD-10-CM | POA: Diagnosis present

## 2023-08-11 DIAGNOSIS — W109XXA Fall (on) (from) unspecified stairs and steps, initial encounter: Secondary | ICD-10-CM | POA: Insufficient documentation

## 2023-08-11 DIAGNOSIS — S22080A Wedge compression fracture of T11-T12 vertebra, initial encounter for closed fracture: Secondary | ICD-10-CM | POA: Insufficient documentation

## 2023-08-11 DIAGNOSIS — W19XXXA Unspecified fall, initial encounter: Secondary | ICD-10-CM

## 2023-08-11 DIAGNOSIS — T07XXXA Unspecified multiple injuries, initial encounter: Secondary | ICD-10-CM

## 2023-08-11 NOTE — ED Triage Notes (Signed)
Patient reports falling down some steps 30 minutes ago. Patient reports pain in the right arm (currently broken from a previous injury) and back pain. Patient was able to ambulate to the triage room without assistance. Denies blood thinners. Unsure if he hit his head.

## 2023-08-12 ENCOUNTER — Emergency Department (HOSPITAL_BASED_OUTPATIENT_CLINIC_OR_DEPARTMENT_OTHER): Payer: Medicaid Other

## 2023-08-12 MED ORDER — IBUPROFEN 800 MG PO TABS
800.0000 mg | ORAL_TABLET | Freq: Once | ORAL | Status: DC
  Filled 2023-08-12: qty 1

## 2023-08-12 MED ORDER — OXYCODONE-ACETAMINOPHEN 5-325 MG PO TABS
1.0000 | ORAL_TABLET | Freq: Once | ORAL | Status: AC
  Administered 2023-08-12: 1 via ORAL
  Filled 2023-08-12: qty 1

## 2023-08-12 MED ORDER — HYDROCODONE-ACETAMINOPHEN 5-325 MG PO TABS
1.0000 | ORAL_TABLET | Freq: Once | ORAL | Status: AC
  Administered 2023-08-12: 1 via ORAL
  Filled 2023-08-12: qty 1

## 2023-08-12 MED ORDER — IBUPROFEN 800 MG PO TABS
800.0000 mg | ORAL_TABLET | Freq: Once | ORAL | Status: AC
  Administered 2023-08-12: 800 mg via ORAL
  Filled 2023-08-12: qty 1

## 2023-08-12 NOTE — ED Notes (Signed)
Spoke with answering service for [facility] for [employer]. Will contact rep for brace fitting

## 2023-08-12 NOTE — ED Notes (Signed)
[employer] Rep called at 1:55am and when I verified this facility's address, she stated she does not cover [place] and will call the answering service back to request the appropriate tech is dispatched here

## 2023-08-12 NOTE — ED Notes (Signed)
[facility] tech at bedside for brace fitting

## 2023-08-12 NOTE — ED Provider Notes (Signed)
[place] [facility] Provider Note   CSN: [id number] Arrival date & time: 08/11/23  2226     History  Chief Complaint  Patient presents with   Marletta Lor    Chad Dunn is a 37 y.o. male.  Reports he fell down several steps approximately 8 or 9 carpeted steps about 30 minutes ago.  He slipped and landed on his back and buttocks. States he slid down the flight of steps on his buttocks and back.  Does not think he hit his head.  No loss of consciousness.  Complains of pain to his right posterior ribs and back.  He has a known ulnar fracture of the right arm which is in a splint and sling.  He states that his arm went above his head when he fell but he does not believe he injured this arm again in the fall.  Having some soreness to his right shoulder.  No new pain to his right forearm.  Denies any head or neck pain.  Denies any chest pain, shortness of breath, abdominal pain, nausea or vomiting.  No blood thinner use.  No new weakness, numbness or tingling.  No leg pain or leg swelling.  Denies of pain to his right posterior ribs and mid back area.  Denies any new pain to his right forearm area.  The history is provided by the patient.  Fall Pertinent negatives include no chest pain, no abdominal pain, no headaches and no shortness of breath.       Home Medications Prior to Admission medications   Medication Sig Start Date End Date Taking? Authorizing Provider  atomoxetine (STRATTERA) 10 MG capsule Take 80 mg by mouth daily.    [provider]  escitalopram (LEXAPRO) 20 MG tablet Take 20 mg by mouth daily.    [provider]  ibuprofen (ADVIL,MOTRIN) 800 MG tablet Take 1 tablet (800 mg total) by mouth every 8 (eight) hours as needed. 02/16/17   Long, Arlyss Repress, MD  oxyCODONE-acetaminophen (PERCOCET) 5-325 MG tablet Take 2 tablets by mouth every 4 (four) hours as needed for severe pain. 07/21/23   Mesner, Barbara Cower, MD      Allergies     Patient has no known allergies.    Review of Systems   Review of Systems  Constitutional:  Negative for activity change, appetite change and fever.  HENT:  Negative for congestion and rhinorrhea.   Respiratory:  Negative for cough, chest tightness and shortness of breath.   Cardiovascular:  Negative for chest pain.  Gastrointestinal:  Negative for abdominal pain, nausea and vomiting.  Genitourinary:  Negative for dysuria.  Musculoskeletal:  Positive for arthralgias, back pain and myalgias.  Skin:  Negative for rash.  Neurological:  Negative for dizziness, weakness and headaches.   all other systems are negative except as noted in the HPI and PMH.    Physical Exam Updated Vital Signs BP (!) 136/95 (BP Location: Left Arm)   Pulse 98   Temp 97.8 F (36.6 C)   Resp 18   Ht 5\' 11"  (1.803 m)   Wt 90.3 kg   SpO2 99%   BMI 27.75 kg/m  Physical Exam Vitals and nursing note reviewed.  Constitutional:      General: He is not in acute distress.    Appearance: He is well-developed.  HENT:     Head: Normocephalic and atraumatic.     Mouth/Throat:     Pharynx: No oropharyngeal exudate.  Eyes:  Conjunctiva/sclera: Conjunctivae normal.     Pupils: Pupils are equal, round, and reactive to light.  Neck:     Comments: No meningismus. Cardiovascular:     Rate and Rhythm: Normal rate and regular rhythm.     Heart sounds: Normal heart sounds. No murmur heard. Pulmonary:     Effort: Pulmonary effort is normal. No respiratory distress.     Breath sounds: Normal breath sounds.  Abdominal:     Palpations: Abdomen is soft.     Tenderness: There is no abdominal tenderness. There is no guarding or rebound.  Musculoskeletal:        General: Tenderness present. Normal range of motion.     Cervical back: Normal range of motion and neck supple.     Comments: Tender midline thoracic and lumbar spine.  Tender right posterior ribs No ecchymosis or crepitus  Splint in place right forearm.   Intact radial pulse and cardinal hand movements.  Full range of motion of right elbow and shoulder.  Skin:    General: Skin is warm.  Neurological:     Mental Status: He is alert and oriented to person, place, and time.     Cranial Nerves: No cranial nerve deficit.     Motor: No abnormal muscle tone.     Coordination: Coordination normal.     Comments:  5/5 strength throughout. CN 2-12 intact.Equal grip strength.   Psychiatric:        Behavior: Behavior normal.     ED Results / Procedures / Treatments   Labs (all labs ordered are listed, but only abnormal results are displayed) Labs Reviewed - No data to display  EKG None  Radiology DG Lumbar Spine Complete  Result Date: 08/12/2023 CLINICAL DATA:  Status post fall. EXAM: LUMBAR SPINE - COMPLETE 4+ VIEW COMPARISON:  None Available. FINDINGS: There is no evidence of lumbar spine fracture. Alignment is normal. Intervertebral disc spaces are maintained. IMPRESSION: Negative. Electronically Signed   By: Aram Candela M.D.   On: 08/12/2023 01:20   DG Cervical Spine Complete  Result Date: 08/12/2023 CLINICAL DATA:  Status post fall. EXAM: CERVICAL SPINE - COMPLETE 4+ VIEW COMPARISON:  None Available. FINDINGS: There is no evidence of cervical spine fracture or prevertebral soft tissue swelling. Alignment is normal. No other significant bone abnormalities are identified. IMPRESSION: Negative cervical spine radiographs. Electronically Signed   By: Aram Candela M.D.   On: 08/12/2023 01:16   DG Thoracic Spine 2 View  Result Date: 08/12/2023 CLINICAL DATA:  Status post fall. EXAM: THORACIC SPINE 2 VIEWS COMPARISON:  None Available. FINDINGS: A radiopaque marker was placed at the site of the patient's pain. A very small area of cortical irregularity is seen along the superior endplate of the T12 vertebral body. Alignment is normal. No other significant bone abnormalities are identified. IMPRESSION: Findings suggestive of a small  fracture deformity involving the superior endplate of the T12 vertebral body. MRI correlation is recommended. Electronically Signed   By: Aram Candela M.D.   On: 08/12/2023 01:15   DG Shoulder Right  Result Date: 08/12/2023 CLINICAL DATA:  Status post fall. EXAM: RIGHT SHOULDER - 2+ VIEW COMPARISON:  None Available. FINDINGS: There is no evidence of fracture or dislocation. There is no evidence of arthropathy or other focal bone abnormality. Soft tissues are unremarkable. IMPRESSION: Negative. Electronically Signed   By: Aram Candela M.D.   On: 08/12/2023 01:06   DG Ribs Unilateral W/Chest Right  Result Date: 08/12/2023 CLINICAL DATA:  Status post  fall with subsequent posterior right rib pain. EXAM: RIGHT RIBS AND CHEST - 3+ VIEW COMPARISON:  May 19, 2023 FINDINGS: A radiopaque marker was placed at the site of the patient's pain. No fracture or other bone lesions are seen involving the ribs. There is no evidence of pneumothorax or pleural effusion. Both lungs are clear. Heart size and mediastinal contours are within normal limits. IMPRESSION: Negative. Electronically Signed   By: Aram Candela M.D.   On: 08/12/2023 01:04   DG Forearm Right  Result Date: 08/12/2023 CLINICAL DATA:  Status post fall. EXAM: RIGHT FOREARM - 2 VIEW COMPARISON:  July 20, 2023 FINDINGS: An acute nondisplaced fracture deformity is seen involving the distal shaft of the right ulna. This is present on the prior exam. There is no evidence of dislocation. Mild soft tissue swelling is seen adjacent to the previously noted fracture site. IMPRESSION: Stable acute nondisplaced fracture of the distal shaft of the right ulna. Electronically Signed   By: Aram Candela M.D.   On: 08/12/2023 01:03    Procedures Procedures    Medications Ordered in ED Medications  HYDROcodone-acetaminophen (NORCO/VICODIN) 5-325 MG per tablet 1 tablet (has no administration in time range)  ibuprofen (ADVIL) tablet 800 mg (has  no administration in time range)    ED Course/ Medical Decision Making/ A&P                                 Medical Decision Making Amount and/or Complexity of Data Reviewed Labs: ordered. Decision-making details documented in ED Course. Radiology: ordered and independent interpretation performed. Decision-making details documented in ED Course. ECG/medicine tests: ordered and independent interpretation performed. Decision-making details documented in ED Course.  Risk Prescription drug management.   Fall Down flight of steps.  Denies hitting head or losing consciousness.  Complains of pain to right posterior back and ribs.  GCS is 15, ABCs intact.  Denies head injury.  He is concerned that his ulnar fracture could have displaced.  X-ray shows ulnar fracture is stable.  Negative x-rays of right shoulder, ribs, lumbar spine, cervical spine.  Possible T12 compression fracture on thoracic spine imaging.  He is tender in this area.  Will treat as acute.  No chest pain or abdominal pain.  No midline C-spine pain.  Will defer CT imaging at this time.  Patient agrees and declines CT scans.  TLSO brace ordered.   Patient tolerating p.o. and ambulatory.  Follow-up with his hand surgeon as well as referral to spine surgery given his T12 compression fracture. Return to the ED with worsening pain, weakness, numbness, tingling, bowel or bladder incontinence or any other concerns.        Final Clinical Impression(s) / ED Diagnoses Final diagnoses:  Fall, initial encounter  Multiple contusions  Compression fracture of T12 vertebra, initial encounter [facility])    Rx / DC Orders ED Discharge Orders     None         Halia Franey, Jeannett Senior, MD 08/12/23 [phone]

## 2023-08-12 NOTE — Discharge Instructions (Signed)
Your x-rays are negative other than a possible compression fracture of your T12 vertebrae.  Wear the brace as prescribed and follow-up with your primary doctor as well as the spine doctor.  Take your pain medication as prescribed.  Return to the ED with worsening pain, weakness, numbness, tingling or other concerns.

## 2023-11-06 ENCOUNTER — Emergency Department (HOSPITAL_BASED_OUTPATIENT_CLINIC_OR_DEPARTMENT_OTHER): Payer: Medicaid Other

## 2023-11-06 ENCOUNTER — Emergency Department (HOSPITAL_BASED_OUTPATIENT_CLINIC_OR_DEPARTMENT_OTHER)
Admission: EM | Admit: 2023-11-06 | Discharge: 2023-11-06 | Disposition: A | Discharge status: Home / Self Care | Payer: Medicaid Other | Attending: Emergency Medicine | Admitting: Emergency Medicine

## 2023-11-06 ENCOUNTER — Other Ambulatory Visit: Payer: Self-pay

## 2023-11-06 ENCOUNTER — Encounter (HOSPITAL_BASED_OUTPATIENT_CLINIC_OR_DEPARTMENT_OTHER): Payer: Self-pay

## 2023-11-06 DIAGNOSIS — M25572 Pain in left ankle and joints of left foot: Secondary | ICD-10-CM | POA: Diagnosis not present

## 2023-11-06 DIAGNOSIS — W19XXXA Unspecified fall, initial encounter: Secondary | ICD-10-CM | POA: Diagnosis not present

## 2023-11-06 DIAGNOSIS — M25552 Pain in left hip: Secondary | ICD-10-CM | POA: Diagnosis present

## 2023-11-06 DIAGNOSIS — S7002XA Contusion of left hip, initial encounter: Secondary | ICD-10-CM | POA: Insufficient documentation

## 2023-11-06 DIAGNOSIS — S0990XA Unspecified injury of head, initial encounter: Secondary | ICD-10-CM | POA: Diagnosis not present

## 2023-11-06 MED ORDER — METHOCARBAMOL 500 MG PO TABS: 500.0000 mg | ORAL_TABLET | Freq: Two times a day (BID) | ORAL | 0 refills | Status: AC

## 2023-11-06 MED ORDER — OXYCODONE-ACETAMINOPHEN 5-325 MG PO TABS
1.0000 | ORAL_TABLET | Freq: Once | ORAL | Status: AC
  Administered 2023-11-06: 1 via ORAL
  Filled 2023-11-06: qty 1

## 2023-11-06 NOTE — ED Triage Notes (Signed)
 Pt states he fell off a roof today after slipping, pt states he fell about 71ft. Pt states he landed on left hip area, denies any loc. Pt c/o left hip and left ankle pain.

## 2023-11-06 NOTE — ED Notes (Signed)

## 2023-11-06 NOTE — ED Provider Notes (Signed)
 [facility] Provider Note   CSN: [id number] Arrival date & time: 11/06/23  1821     History  Chief Complaint  Patient presents with   Chad Dunn    Chad Dunn is a 38 y.o. male with overall noncontributory past medical history who presents with concern for mechanical, nonsyncopal fall off of a roof earlier today.  Patient reports he fell onto his left hip, and left ankle.  He denies any loss of conscious.  He reports that after hitting the ground he did whiplash back and hit his head.  He denies headache, nausea, vomiting, confusion, dizziness.  He did not take anything for pain prior to arrival.   [facility] Medications Prior to Admission medications   Medication Sig Start Date End Date Taking? Authorizing Provider  methocarbamol  (ROBAXIN ) 500 MG tablet Take 1 tablet (500 mg total) by mouth 2 (two) times daily. 11/06/23  Yes Chad Dunn H, PA-C  atomoxetine (STRATTERA) 10 MG capsule Take 80 mg by mouth daily.    [provider]  escitalopram (LEXAPRO) 20 MG tablet Take 20 mg by mouth daily.    [provider]  ibuprofen  (ADVIL ,MOTRIN ) 800 MG tablet Take 1 tablet (800 mg total) by mouth every 8 (eight) hours as needed. 02/16/17   Chad Dunn MATSU, MD  oxyCODONE -acetaminophen  (PERCOCET) 5-325 MG tablet Take 2 tablets by mouth every 4 (four) hours as needed for severe pain. 07/21/23   Mesner, Selinda, MD      Allergies    Patient has no known allergies.    Review of Systems   Review of Systems  All other systems reviewed and are negative.   Physical Exam Updated Vital Signs BP (!) 129/101 (BP Location: Left Arm)   Pulse 93   Temp 97.9 F (36.6 C)   Resp 18   SpO2 98%  Physical Exam Vitals and nursing note reviewed.  Constitutional:      General: He is not in acute distress.    Appearance: Normal appearance.  HENT:     Head: Normocephalic and atraumatic.     Comments: No evidence of skull trauma  noted on exam. Eyes:     General:        Right eye: No discharge.        Left eye: No discharge.  Cardiovascular:     Rate and Rhythm: Normal rate and regular rhythm.     Heart sounds: No murmur heard.    No friction rub. No gallop.  Pulmonary:     Effort: Pulmonary effort is normal.     Breath sounds: Normal breath sounds.  Abdominal:     General: Bowel sounds are normal.     Palpations: Abdomen is soft.  Musculoskeletal:     Comments: Mild tenderness to palpation on the lateral left ankle with no step-off, deformity.  He is some skin abrasions with no active bleeding, no evidence of foreign body. Intact strength 5/5 of bilateral upper and lower extremities.  He has some contusion and tenderness palpation over the left hip without step-off, deformity.  Normal range of motion at the left hip to flexion, extension.  Patient is able to ambulate without significant difficulty.  Skin:    General: Skin is warm and dry.     Capillary Refill: Capillary refill takes less than 2 seconds.  Neurological:     Mental Status: He is alert and oriented to person, place, and time.  Comments: Moves all 4 limbs spontaneously, CN II through XII grossly intact, can ambulate without difficulty, intact sensation throughout.   Psychiatric:        Mood and Affect: Mood normal.        Behavior: Behavior normal.     ED Results / Procedures / Treatments   Labs (all labs ordered are listed, but only abnormal results are displayed) Labs Reviewed - No data to display  EKG None  Radiology CT Cervical Spine Wo Contrast Result Date: 11/06/2023 CLINICAL DATA:  Neck trauma, dangerous injury mechanism (Age 20-64y). Fall off roof. EXAM: CT CERVICAL SPINE WITHOUT CONTRAST TECHNIQUE: Multidetector CT imaging of the cervical spine was performed without intravenous contrast. Multiplanar CT image reconstructions were also generated. RADIATION DOSE REDUCTION: This exam was performed according to the departmental  dose-optimization program which includes automated exposure control, adjustment of the mA and/or kV according to patient size and/or use of iterative reconstruction technique. COMPARISON:  None Available. FINDINGS: Alignment: Normal Skull base and vertebrae: No acute fracture. No primary bone lesion or focal pathologic process. Soft tissues and spinal canal: No prevertebral fluid or swelling. No visible canal hematoma. Disc levels:  Normal Upper chest: Negative Other: None IMPRESSION: Normal study. Electronically Signed   By: Chad Crease M.D.   On: 11/06/2023 20:36   CT Head Wo Contrast Result Date: 11/06/2023 CLINICAL DATA:  Fall off roof.  Head trauma. EXAM: CT HEAD WITHOUT CONTRAST TECHNIQUE: Contiguous axial images were obtained from the base of the skull through the vertex without intravenous contrast. RADIATION DOSE REDUCTION: This exam was performed according to the departmental dose-optimization program which includes automated exposure control, adjustment of the mA and/or kV according to patient size and/or use of iterative reconstruction technique. COMPARISON:  None Available. FINDINGS: Brain: No acute intracranial abnormality. Specifically, no hemorrhage, hydrocephalus, mass lesion, acute infarction, or significant intracranial injury. Vascular: No hyperdense vessel or unexpected calcification. Skull: No acute calvarial abnormality. Sinuses/Orbits: No acute findings Other: None IMPRESSION: No acute intracranial abnormality. Electronically Signed   By: Chad Crease M.D.   On: 11/06/2023 20:36   DG Ankle Complete Left Result Date: 11/06/2023 CLINICAL DATA:  Recent fall from ladder with ankle pain, initial encounter EXAM: LEFT ANKLE COMPLETE - 3+ VIEW COMPARISON:  None Available. FINDINGS: There is no evidence of fracture, dislocation, or joint effusion. There is no evidence of arthropathy or other focal bone abnormality. Soft tissues are unremarkable. IMPRESSION: No acute abnormality noted.  Electronically Signed   By: Chad Devonshire M.D.   On: 11/06/2023 19:15   DG Hip Unilat W or Wo Pelvis 2-3 Views Left Result Date: 11/06/2023 CLINICAL DATA:  Recent fall from ladder with hip pain, initial encounter EXAM: DG HIP (WITH OR WITHOUT PELVIS) 2-3V LEFT COMPARISON:  None Available. FINDINGS: Pelvic ring is intact. No acute fracture or dislocation is noted. No soft tissue abnormality is seen. IMPRESSION: No acute abnormality noted. Electronically Signed   By: Chad Devonshire M.D.   On: 11/06/2023 19:15    Procedures Procedures    Medications Ordered in ED Medications  oxyCODONE -acetaminophen  (PERCOCET/ROXICET) 5-325 MG per tablet 1 tablet (1 tablet Oral Given 11/06/23 2035)    ED Course/ Medical Decision Making/ A&P                                 Medical Decision Making Amount and/or Complexity of Data Reviewed Radiology: ordered.   This patient is a 38  y.o. male  who presents to the ED for concern of fall, head injury, left hip pain, left ankle pain.   Differential diagnoses prior to evaluation: The emergent differential diagnosis includes, but is not limited to, fracture, dislocation, intracranial injury including subdural hematoma, epidural hematoma, concussion, versus other. This is not an exhaustive differential.   Past Medical History / Co-morbidities / Social History: Anxiety, depression  Physical Exam: Physical exam performed. The pertinent findings include: Mild tenderness to palpation on the lateral left ankle with no step-off, deformity.  He is some skin abrasions with no active bleeding, no evidence of foreign body. Intact strength 5/5 of bilateral upper and lower extremities.  He has some contusion and tenderness palpation over the left hip without step-off, deformity.  Normal range of motion at the left hip to flexion, extension.  Patient is able to ambulate without significant difficulty.   Moves all 4 limbs spontaneously, CN II through XII grossly intact, can  ambulate without difficulty, intact sensation throughout.    Lab Tests/Imaging studies: I personally interpreted labs/imaging and the pertinent results include:  I independently interpreted plain film radiograph of the left ankle, left hip, CT of the C-spine and CT of the head which showed no evidence of acute intracranial, cervical spine abnormality, no fracture, dislocation of the hip or ankle.. I agree with the radiologist interpretation.   Medications: I ordered medication including percocet for pain.  I have reviewed the patients home medicines and have made adjustments as needed.   Disposition: After consideration of the diagnostic results and the patients response to treatment, I feel that patient is stable for discharge with plan as above.   emergency department workup does not suggest an emergent condition requiring admission or immediate intervention beyond what has been performed at this time. The plan is: as above. The patient is safe for discharge and has been instructed to return immediately for worsening symptoms, change in symptoms or any other concerns.  Final Clinical Impression(s) / ED Diagnoses Final diagnoses:  Fall, initial encounter  Injury of head, initial encounter  Contusion of left hip, initial encounter  Acute left ankle pain    Rx / DC Orders ED Discharge Orders          Ordered    methocarbamol  (ROBAXIN ) 500 MG tablet  2 times daily        11/06/23 2108              Rosan Sherlean VEAR DEVONNA 11/06/23 2108    Armenta Canning, MD 11/06/23 2209

## 2023-11-06 NOTE — Discharge Instructions (Signed)

## 2024-06-29 ENCOUNTER — Telehealth: Admitting: Nurse Practitioner

## 2024-06-29 DIAGNOSIS — F902 Attention-deficit hyperactivity disorder, combined type: Secondary | ICD-10-CM | POA: Diagnosis not present

## 2024-06-29 DIAGNOSIS — F3181 Bipolar II disorder: Secondary | ICD-10-CM

## 2024-06-29 DIAGNOSIS — F419 Anxiety disorder, unspecified: Secondary | ICD-10-CM | POA: Diagnosis not present

## 2024-06-29 NOTE — Progress Notes (Signed)
   Subjective:  CC: I'm doing a lot better.  HPI: Chad Dunn is a 38 y.o. male presenting on 06/29/2024 via telehealth to establish psychiatric care. He is a previous patient of this provider at previous practice and wishes to continue ongoing care. Patient carries a diagnosis of ADHD, Bipolar I and GAD. He is currently on Lexapro, Lamictal, Strattera and Wellbutrin XL. Patient reports his overall mood is stable with no new mental health issues, concerns or complaints and doing well on his current medications and dose.   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 Reports things are going better and they have moved into a new place. He reports continued improvement with decreased anxiety, depressive symptoms, agitation, irritability and his overall mood is stable. He reports he is less hyper focused with decrease in his Strattera. He reports improvement with focus, concentration, attention span without getting easily distracted and able to stay on task.  He reports he is doing better with putting his phone away and going to sleep at night. He denies poor appetite, adverse reaction to his medications, psychosis, delusions, homicidal or homicidal ideations.    ROS: Negative unless specifically indicated above in HPI.   Relevant past medical history reviewed and updated as indicated.   Allergies and medications reviewed and updated.   Current Outpatient Medications  Medication Instructions   atomoxetine (STRATTERA) 25 mg, Every morning   escitalopram (LEXAPRO) 20 mg, Daily   ibuprofen  (ADVIL ) 800 mg, Oral, Every 8 hours PRN   lamoTRIgine (LAMICTAL) 25 mg, Oral, 2 times daily   methocarbamol  (ROBAXIN ) 500 mg, Oral, 2 times daily   oxyCODONE -acetaminophen  (PERCOCET) 5-325 MG tablet 2 tablets, Oral, Every 4 hours PRN     No Known Allergies  Objective:   There were no vitals taken for this visit.   Physical  Exam Constitutional:      Appearance: Normal appearance.  Neurological:     General: No focal deficit present.     Mental Status: He is alert and oriented to person, place, and time.  Psychiatric:        Attention and Perception: Attention and perception normal.        Mood and Affect: Mood and affect normal.        Speech: Speech normal.        Behavior: Behavior normal. Behavior is cooperative.        Thought Content: Thought content normal. Thought content is not paranoid or delusional. Thought content does not include homicidal or suicidal ideation.        Cognition and Memory: Cognition and memory normal.        Judgment: Judgment normal.    Assessment & Plan:   Assessment & Plan Attention deficit hyperactivity disorder (ADHD), combined type Continue Strattera 25 mg PO QAM     Anxiety Continue Lexapro 20 mg PO daily     Bipolar 2 disorder (HCC) Continue Lamictal 25 mg PO BID Wellbutrin XL 150 mg PO QAM        Follow up plan: No follow-ups on file.  Florencia Cousin, NP

## 2024-07-13 DIAGNOSIS — F3181 Bipolar II disorder: Secondary | ICD-10-CM | POA: Insufficient documentation

## 2024-07-13 DIAGNOSIS — F419 Anxiety disorder, unspecified: Secondary | ICD-10-CM | POA: Insufficient documentation

## 2024-07-13 DIAGNOSIS — F902 Attention-deficit hyperactivity disorder, combined type: Secondary | ICD-10-CM | POA: Insufficient documentation

## 2024-07-13 NOTE — Assessment & Plan Note (Addendum)
 Continue Lexapro 20 mg PO daily

## 2024-07-13 NOTE — Assessment & Plan Note (Addendum)
 Continue Strattera 25 mg PO QAM

## 2024-07-13 NOTE — Assessment & Plan Note (Addendum)
 Continue Lamictal 25 mg PO BID Wellbutrin XL 150 mg PO QAM

## 2024-07-27 ENCOUNTER — Ambulatory Visit: Admitting: Nurse Practitioner

## 2024-10-12 ENCOUNTER — Ambulatory Visit: Admitting: Nurse Practitioner

## 2024-11-09 ENCOUNTER — Telehealth: Payer: Self-pay | Admitting: Nurse Practitioner

## 2024-11-09 NOTE — Progress Notes (Unsigned)
" ° °  Subjective:  No chief complaint on file.    HPI: Chad Dunn is a 39 y.o. male presenting on 11/09/2024 with report of ***  Haze, migraine, fatigue, irritabiliy   ROS: Negative unless specifically indicated above in HPI.   Relevant past medical history reviewed and updated as indicated.   Allergies and medications reviewed and updated.   Current Outpatient Medications  Medication Instructions   atomoxetine (STRATTERA) 25 mg, Every morning   escitalopram (LEXAPRO) 20 mg, Daily   ibuprofen  (ADVIL ) 800 mg, Oral, Every 8 hours PRN   lamoTRIgine (LAMICTAL) 25 mg, Oral, 2 times daily   methocarbamol  (ROBAXIN ) 500 mg, Oral, 2 times daily   oxyCODONE -acetaminophen  (PERCOCET) 5-325 MG tablet 2 tablets, Oral, Every 4 hours PRN     Allergies[1]  Objective:   There were no vitals taken for this visit.   Physical Exam Constitutional:      Appearance: Normal appearance.  HENT:     Head: Normocephalic.  Eyes:     Conjunctiva/sclera: Conjunctivae normal.  Cardiovascular:     Rate and Rhythm: Normal rate and regular rhythm.  Pulmonary:     Effort: Pulmonary effort is normal.     Breath sounds: Normal breath sounds.  Skin:    General: Skin is warm and dry.  Neurological:     General: No focal deficit present.     Mental Status: He is alert and oriented to person, place, and time.  Psychiatric:        Mood and Affect: Mood normal.        Behavior: Behavior normal.        Thought Content: Thought content normal.        Judgment: Judgment normal.     Eye Contact:  {BHH EYE CONTACT:22684}  Speech:  {Speech:22685}  Volume:  {Volume (PAA):22686}  Mood:  {BHH MOOD:22306}  Affect:  {Affect (PAA):22687}  Thought Process:  {Thought Process (PAA):22688}  Orientation:  {BHH ORIENTATION (PAA):22689}  Thought Content:  {Thought Content:22690}  Suicidal Thoughts:  {ST/HT (PAA):22692}  Homicidal Thoughts:  {ST/HT (PAA):22692}  Memory:  {BHH MEMORY:22881}  Judgement:   {Judgement (PAA):22694}  Insight:  {Insight (PAA):22695}  Psychomotor Activity:  {Psychomotor (PAA):22696}  Concentration:  {Concentration:21399}  Recall:  {BHH GOOD/FAIR/POOR:22877}  Fund of Knowledge:  {BHH GOOD/FAIR/POOR:22877}  Language:  {BHH GOOD/FAIR/POOR:22877}  Akathisia:  {BHH YES OR NO:22294}  Handed:  {Handed:22697}  AIMS (if indicated):     Assets:  {Assets (PAA):22698}  ADL's:  {BHH JIO'D:77709}  Cognition:  {chl bhh cognition:304700322}  Sleep:        Assessment & Plan:   Assessment & Plan     Follow up plan: No follow-ups on file.  Florencia Cousin, NP        [1] No Known Allergies "

## 2024-11-16 ENCOUNTER — Telehealth: Admitting: Nurse Practitioner

## 2024-12-07 ENCOUNTER — Telehealth: Admitting: Nurse Practitioner
